# Patient Record
Sex: Female | Born: 1995
Health system: Southern US, Community
[De-identification: ages and names within clinical notes are randomized; demographics above are authoritative.]

## PROBLEM LIST (undated history)

## (undated) DIAGNOSIS — N739 Female pelvic inflammatory disease, unspecified: Secondary | ICD-10-CM

## (undated) DIAGNOSIS — R519 Headache, unspecified: Secondary | ICD-10-CM

## (undated) DIAGNOSIS — F329 Major depressive disorder, single episode, unspecified: Secondary | ICD-10-CM

## (undated) DIAGNOSIS — G43909 Migraine, unspecified, not intractable, without status migrainosus: Secondary | ICD-10-CM

## (undated) DIAGNOSIS — F32A Depression, unspecified: Secondary | ICD-10-CM

## (undated) DIAGNOSIS — F3281 Premenstrual dysphoric disorder: Secondary | ICD-10-CM

## (undated) DIAGNOSIS — R51 Headache: Secondary | ICD-10-CM

## (undated) DIAGNOSIS — F419 Anxiety disorder, unspecified: Secondary | ICD-10-CM

## (undated) HISTORY — DX: Female pelvic inflammatory disease, unspecified: N73.9

## (undated) HISTORY — DX: Premenstrual dysphoric disorder: F32.81

---
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
## 2016-01-07 ENCOUNTER — Encounter: Payer: Self-pay | Admitting: Emergency Medicine

## 2016-01-07 ENCOUNTER — Emergency Department
Admission: EM | Admit: 2016-01-07 | Discharge: 2016-01-08 | Disposition: A | Discharge status: Home / Self Care | Payer: Federal, State, Local not specified - PPO | Attending: Emergency Medicine | Admitting: Emergency Medicine

## 2016-01-07 DIAGNOSIS — Z792 Long term (current) use of antibiotics: Secondary | ICD-10-CM | POA: Insufficient documentation

## 2016-01-07 DIAGNOSIS — Z87891 Personal history of nicotine dependence: Secondary | ICD-10-CM | POA: Insufficient documentation

## 2016-01-07 DIAGNOSIS — R112 Nausea with vomiting, unspecified: Secondary | ICD-10-CM | POA: Diagnosis present

## 2016-01-07 DIAGNOSIS — Z79899 Other long term (current) drug therapy: Secondary | ICD-10-CM | POA: Diagnosis not present

## 2016-01-07 DIAGNOSIS — J020 Streptococcal pharyngitis: Secondary | ICD-10-CM | POA: Insufficient documentation

## 2016-01-07 DIAGNOSIS — R509 Fever, unspecified: Secondary | ICD-10-CM

## 2016-01-07 HISTORY — DX: Migraine, unspecified, not intractable, without status migrainosus: G43.909

## 2016-01-07 LAB — CBC
HCT: 40.1 % (ref 35.0–47.0)
Hemoglobin: 13.7 g/dL (ref 12.0–16.0)
MCH: 28.6 pg (ref 26.0–34.0)
MCHC: 34.1 g/dL (ref 32.0–36.0)
MCV: 83.9 fL (ref 80.0–100.0)
PLATELETS: 149 10*3/uL — AB (ref 150–440)
RBC: 4.77 MIL/uL (ref 3.80–5.20)
RDW: 13 % (ref 11.5–14.5)
WBC: 13 10*3/uL — ABNORMAL HIGH (ref 3.6–11.0)

## 2016-01-07 MED ORDER — ACETAMINOPHEN 650 MG RE SUPP
650.0000 mg | Freq: Once | RECTAL | Status: AC
  Administered 2016-01-07: 650 mg via RECTAL
  Filled 2016-01-07: qty 1

## 2016-01-07 MED ORDER — LIDOCAINE VISCOUS 2 % MT SOLN
15.0000 mL | Freq: Once | OROMUCOSAL | Status: AC
  Administered 2016-01-08: 15 mL via OROMUCOSAL
  Filled 2016-01-07: qty 15

## 2016-01-07 MED ORDER — DEXAMETHASONE SODIUM PHOSPHATE 10 MG/ML IJ SOLN
10.0000 mg | Freq: Once | INTRAMUSCULAR | Status: AC
  Administered 2016-01-07: 10 mg via INTRAVENOUS
  Filled 2016-01-07: qty 1

## 2016-01-07 MED ORDER — IBUPROFEN 100 MG/5ML PO SUSP
600.0000 mg | Freq: Once | ORAL | Status: AC
  Administered 2016-01-08: 600 mg via ORAL
  Filled 2016-01-07 (×2): qty 30

## 2016-01-07 MED ORDER — SODIUM CHLORIDE 0.9 % IV BOLUS (SEPSIS)
1000.0000 mL | Freq: Once | INTRAVENOUS | Status: AC
  Administered 2016-01-07: 1000 mL via INTRAVENOUS

## 2016-01-07 MED ORDER — ONDANSETRON HCL 4 MG/2ML IJ SOLN
4.0000 mg | Freq: Once | INTRAMUSCULAR | Status: AC
  Administered 2016-01-07: 4 mg via INTRAVENOUS
  Filled 2016-01-07: qty 2

## 2016-01-07 NOTE — ED Notes (Signed)
Pt was seen today at [facility] and diagnosed with strep throat; prescribed (and filled) PenVK and Zofran; has been running a fever all day of 103.0; now with abdominal pain and vomiting-has not started taking; pt says she was too weak to open the Zofran so she hasn't taken any; pt adds her mother is a physician and asked if she could get the PenVK shot while here instead of taking all the pills; pt awake and alert; talking in complete coherent sentences

## 2016-01-07 NOTE — ED Provider Notes (Signed)
[facility] Emergency Department Provider Note  ____________________________________________  Time seen: Approximately 11:35 PM  I have reviewed the triage vital signs and the nursing notes.   HISTORY  Chief Complaint Abdominal Pain and Emesis    HPI Lauren Braun is a 20 y.o. female who comes into the hospital today with a fever. The patient was diagnosed today with strep throat at [facility]. The patient's significant other reports thatshe has been unable to keep down any medicine, drooling and in and out of consciousness. They report that at 8 PM the patient vomited blood but they feel it was from her throat because it is so swollen. The patient has had a temperature up to 103 at home but is unable to keep anything down. She was also given Zofran for nausea but again hasn't been able to keep anything down. She has body aches and numbness and tingling in her fingers and toes. She reports that she can't even keep fluids down but has been trying to drink some water. The patient denies any sick contacts but reports that she induced vomiting and another person about a week ago. She reports that her mother is a physician and was wondering if she could receive a shot of antibiotic instead of oral antibiotics. Patient rates her throat pain as an 8 out of 10 in intensity. Patient reports that she has not eaten today and has some hunger pains but denies outright abdominal pain.   Past Medical History  Diagnosis Date  . Anxiety   . Depression   . Headache   . Migraines     Patient Active Problem List   Diagnosis Date Noted  . PTSD (post-traumatic stress disorder) 02/22/2015    History reviewed. No pertinent past surgical history.  Current Outpatient Rx  Name  Route  Sig  Dispense  Refill  . ondansetron (ZOFRAN-ODT) 4 MG disintegrating tablet   Oral   Take 4 mg by mouth 4 (four) times daily as needed for nausea or vomiting.         .  penicillin v potassium (VEETID) 500 MG tablet   Oral   Take 500 mg by mouth 2 (two) times daily.         . citalopram (CELEXA) 20 MG tablet   Oral   Take 1 tablet (20 mg total) by mouth daily.   30 tablet   1   . ibuprofen (ADVIL,MOTRIN) 200 MG tablet   Oral   Take 1 tablet (200 mg total) by mouth every 6 (six) hours as needed for headache (for migraine).   30 tablet   0   . sulfacetaminde-prednisoLONE (BLEPHAMIDE) ophthalmic ointment      Apply topically to right eyelid area of inflammation   3.5 g   0     Seborrheic blepharitis     Allergies Review of patient's allergies indicates no known allergies.  History reviewed. No pertinent family history.  Social History Social History  Substance Use Topics  . Smoking status: Former Games developer  . Smokeless tobacco: None  . Alcohol Use: Yes     Comment: occasional    Review of Systems Constitutional:  fever/chills Eyes: No visual changes. ENT:  sore throat. Cardiovascular: Denies chest pain. Respiratory: Denies shortness of breath. Gastrointestinal: Vomiting No abdominal pain. No diarrhea.  No constipation. Genitourinary: Negative for dysuria. Musculoskeletal: Negative for back pain. Skin: Negative for rash. Neurological: Negative for headaches, focal weakness or numbness.  10-point ROS otherwise negative.  ____________________________________________  PHYSICAL EXAM:  VITAL SIGNS: ED Triage Vitals  Enc Vitals Group     BP 01/07/16 2203 117/50 mmHg     Pulse Rate 01/07/16 2203 122     Resp 01/07/16 2203 20     Temp 01/07/16 2203 100.6 F (38.1 C)     Temp Source 01/07/16 2203 Oral     SpO2 01/07/16 2203 100 %     Weight 01/07/16 2203 114 lb (51.71 kg)     Height 01/07/16 2203  (1.676 m)     Head Cir --      Peak Flow --      Pain Score 01/07/16 2203 8     Pain Loc --      Pain Edu? --      Excl. in GC? --     Constitutional: Alert and oriented. Well appearing and in moderate distress. Eyes:  Conjunctivae are normal. PERRL. EOMI. Head: Atraumatic. Nose: No congestion/rhinnorhea. Mouth/Throat: Mucous membranes are moist.  Oropharynx erythematous with tonsillar exudate and cervical lymphadenopathy. Cardiovascular: Normal rate, regular rhythm. Grossly normal heart sounds.  Good peripheral circulation. Respiratory: Normal respiratory effort.  No retractions. Lungs CTAB. Gastrointestinal: Soft and nontender. No distention. Positive bowel sounds Musculoskeletal: No lower extremity tenderness nor edema.   Neurologic:  Normal speech and language.  Skin:  Skin is warm, dry and intact.  Psychiatric: Mood and affect are normal.   ____________________________________________   LABS (all labs ordered are listed, but only abnormal results are displayed)  Labs Reviewed  CBC - Abnormal; Notable for the following:    WBC 13.0 (*)    Platelets 149 (*)    All other components within normal limits  COMPREHENSIVE METABOLIC PANEL - Abnormal; Notable for the following:    CO2 21 (*)    Calcium 8.7 (*)    ALT 11 (*)    All other components within normal limits  MONONUCLEOSIS SCREEN  TROPONIN I  URINALYSIS COMPLETEWITH MICROSCOPIC ([facility] ONLY)   ____________________________________________  EKG  None ____________________________________________  RADIOLOGY  None ____________________________________________   PROCEDURES  Procedure(s) performed: None  Critical Care performed: No  ____________________________________________   INITIAL IMPRESSION / ASSESSMENT AND PLAN / ED COURSE  Pertinent labs & imaging results that were available during my care of the patient were reviewed by me and considered in my medical decision making (see chart for details).  This is an 20 year old female who was diagnosed with strep throat today that is also having some vomiting and unable to keep anything down due to pain and vomiting. The patient did receive a Tylenol suppository as well as Zofran  normal saline and Decadron prior to my evaluation. I will give the patient some oral ibuprofen as well as some viscous lidocaine and then attempt a by mouth trial. I will reassess the patient when she's received her medications.  The patient received 2 L of normal saline which helped with her symptoms. I did initially order the patient a dose of Bicillin but she was afraid that the medicine but hurt. After receiving the viscous lidocaine as well as the liquid ibuprofen the patient's temperature was improved and she felt that she would be able to keep down fluids. The patient ate a popsicle and then took her home dose of penicillin. The patient will be discharged to home to follow-up with student health. ____________________________________________   FINAL CLINICAL IMPRESSION(S) / ED DIAGNOSES  Final diagnoses:  Non-intractable vomiting with nausea, vomiting of unspecified type  Strep throat  Fever, unspecified  fever cause      Rebecka Apley, MD 01/08/16 [phone]

## 2016-01-08 LAB — COMPREHENSIVE METABOLIC PANEL
ALBUMIN: 4.1 g/dL (ref 3.5–5.0)
ALT: 11 U/L — ABNORMAL LOW (ref 14–54)
ANION GAP: 10 (ref 5–15)
AST: 17 U/L (ref 15–41)
Alkaline Phosphatase: 51 U/L (ref 38–126)
BUN: 11 mg/dL (ref 6–20)
CALCIUM: 8.7 mg/dL — AB (ref 8.9–10.3)
CO2: 21 mmol/L — AB (ref 22–32)
Chloride: 104 mmol/L (ref 101–111)
Creatinine, Ser: 0.82 mg/dL (ref 0.44–1.00)
GFR calc Af Amer: >60 mL/min (ref >60)
GFR calc non Af Amer: >60 mL/min (ref >60)
Glucose, Bld: 97 mg/dL (ref 65–99)
POTASSIUM: 3.5 mmol/L (ref 3.5–5.1)
SODIUM: 135 mmol/L (ref 135–145)
Total Bilirubin: 0.9 mg/dL (ref 0.3–1.2)
Total Protein: 7.8 g/dL (ref 6.5–8.1)

## 2016-01-08 LAB — MONONUCLEOSIS SCREEN: Mono Screen: NEGATIVE

## 2016-01-08 LAB — TROPONIN I

## 2016-01-08 MED ORDER — PENICILLIN G BENZATHINE 1200000 UNIT/2ML IM SUSP
1.2000 10*6.[IU] | Freq: Once | INTRAMUSCULAR | Status: DC
  Filled 2016-01-08 (×2): qty 2

## 2016-01-08 MED ORDER — SODIUM CHLORIDE 0.9 % IV BOLUS (SEPSIS)
1000.0000 mL | Freq: Once | INTRAVENOUS | Status: AC
  Administered 2016-01-08: 1000 mL via INTRAVENOUS

## 2016-01-08 NOTE — Discharge Instructions (Signed)
Nausea and Vomiting °Nausea is a sick feeling that often comes before throwing up (vomiting). Vomiting is a reflex where stomach contents come out of your mouth. Vomiting can cause severe loss of body fluids (dehydration). Children and elderly adults can become dehydrated quickly, especially if they also have diarrhea. Nausea and vomiting are symptoms of a condition or disease. It is important to find the cause of your symptoms. °CAUSES  °· Direct irritation of the stomach lining. This irritation can result from increased acid production (gastroesophageal reflux disease), infection, food poisoning, taking certain medicines (such as nonsteroidal anti-inflammatory drugs), alcohol use, or tobacco use. °· Signals from the brain. These signals could be caused by a headache, heat exposure, an inner ear disturbance, increased pressure in the brain from injury, infection, a tumor, or a concussion, pain, emotional stimulus, or metabolic problems. °· An obstruction in the gastrointestinal tract (bowel obstruction). °· Illnesses such as diabetes, hepatitis, gallbladder problems, appendicitis, kidney problems, cancer, sepsis, atypical symptoms of a heart attack, or eating disorders. °· Medical treatments such as chemotherapy and radiation. °· Receiving medicine that makes you sleep (general anesthetic) during surgery. °DIAGNOSIS °Your caregiver may ask for tests to be done if the problems do not improve after a few days. Tests may also be done if symptoms are severe or if the reason for the nausea and vomiting is not clear. Tests may include: °· Urine tests. °· Blood tests. °· Stool tests. °· Cultures (to look for evidence of infection). °· X-rays or other imaging studies. °Test results can help your caregiver make decisions about treatment or the need for additional tests. °TREATMENT °You need to stay well hydrated. Drink frequently but in small amounts. You may wish to drink water, sports drinks, clear broth, or eat frozen  ice pops or gelatin dessert to help stay hydrated. When you eat, eating slowly may help prevent nausea. There are also some antinausea medicines that may help prevent nausea. °HOME CARE INSTRUCTIONS  °· Take all medicine as directed by your caregiver. °· If you do not have an appetite, do not force yourself to eat. However, you must continue to drink fluids. °· If you have an appetite, eat a normal diet unless your caregiver tells you differently. °· Eat a variety of complex carbohydrates (rice, wheat, potatoes, bread), lean meats, yogurt, fruits, and vegetables. °· Avoid high-fat foods because they are more difficult to digest. °· Drink enough water and fluids to keep your urine clear or pale yellow. °· If you are dehydrated, ask your caregiver for specific rehydration instructions. Signs of dehydration may include: °· Severe thirst. °· Dry lips and mouth. °· Dizziness. °· Dark urine. °· Decreasing urine frequency and amount. °· Confusion. °· Rapid breathing or pulse. °SEEK IMMEDIATE MEDICAL CARE IF:  °· You have blood or brown flecks (like coffee grounds) in your vomit. °· You have black or bloody stools. °· You have a severe headache or stiff neck. °· You are confused. °· You have severe abdominal pain. °· You have chest pain or trouble breathing. °· You do not urinate at least once every 8 hours. °· You develop cold or clammy skin. °· You continue to vomit for longer than 24 to 48 hours. °· You have a fever. °MAKE SURE YOU:  °· Understand these instructions. °· Will watch your condition. °· Will get help right away if you are not doing well or get worse. °  °This information is not intended to replace advice given to you by your health care provider. Make sure   you discuss any questions you have with your health care provider. °  °Document Released: 12/02/2005 Document Revised: 02/24/2012 Document Reviewed: 05/01/2011 °Elsevier Interactive Patient Education ©2016 [employer] ° °Pharyngitis °Pharyngitis is  redness, pain, and swelling (inflammation) of your pharynx.  °CAUSES  °Pharyngitis is usually caused by infection. Most of the time, these infections are from viruses (viral) and are part of a cold. However, sometimes pharyngitis is caused by bacteria (bacterial). Pharyngitis can also be caused by allergies. Viral pharyngitis may be spread from person to person by coughing, sneezing, and personal items or utensils (cups, forks, spoons, toothbrushes). Bacterial pharyngitis may be spread from person to person by more intimate contact, such as kissing.  °SIGNS AND SYMPTOMS  °Symptoms of pharyngitis include:   °· Sore throat.   °· Tiredness (fatigue).   °· Low-grade fever.   °· Headache. °· Joint pain and muscle aches. °· Skin rashes. °· Swollen lymph nodes. °· Plaque-like film on throat or tonsils (often seen with bacterial pharyngitis). °DIAGNOSIS  °Your health care provider will ask you questions about your illness and your symptoms. Your medical history, along with a physical exam, is often all that is needed to diagnose pharyngitis. Sometimes, a rapid strep test is done. Other lab tests may also be done, depending on the suspected cause.  °TREATMENT  °Viral pharyngitis will usually get better in 3-4 days without the use of medicine. Bacterial pharyngitis is treated with medicines that kill germs (antibiotics).  °HOME CARE INSTRUCTIONS  °· Drink enough water and fluids to keep your urine clear or pale yellow.   °· Only take over-the-counter or prescription medicines as directed by your health care provider:   °¨ If you are prescribed antibiotics, make sure you finish them even if you start to feel better.   °¨ Do not take aspirin.   °· Get lots of rest.   °· Gargle with 8 oz of salt water (½ tsp of salt per 1 qt of water) as often as every 1-2 hours to soothe your throat.   °· Throat lozenges (if you are not at risk for choking) or sprays may be used to soothe your throat. °SEEK MEDICAL CARE IF:  °· You have large,  tender lumps in your neck. °· You have a rash. °· You cough up green, yellow-brown, or bloody spit. °SEEK IMMEDIATE MEDICAL CARE IF:  °· Your neck becomes stiff. °· You drool or are unable to swallow liquids. °· You vomit or are unable to keep medicines or liquids down. °· You have severe pain that does not go away with the use of recommended medicines. °· You have trouble breathing (not caused by a stuffy nose). °MAKE SURE YOU:  °· Understand these instructions. °· Will watch your condition. °· Will get help right away if you are not doing well or get worse. °  °This information is not intended to replace advice given to you by your health care provider. Make sure you discuss any questions you have with your health care provider. °  °Document Released: 12/02/2005 Document Revised: 09/22/2013 Document Reviewed: 08/09/2013 °Elsevier Interactive Patient Education ©2016 Elsevier Inc. ° °

## 2016-01-08 NOTE — ED Notes (Signed)
Pt declines pcn shot at this time.

## 2016-01-08 NOTE — ED Notes (Signed)
Pt states still feels like she has difficulty swallowing. Pt concerned regarding strep treatment. Order for pcn im ordered by md.

## 2016-01-08 NOTE — ED Notes (Signed)
Pt sleeping. 

## 2016-01-08 NOTE — ED Notes (Signed)
Pt awoken from sleep for repeat temperature.

## 2016-01-08 NOTE — ED Notes (Signed)
Pt has kept popsicle down.

## 2016-01-08 NOTE — ED Notes (Signed)
md notified of pt's declination of pcn shot. Pt states "i think i will try to keep the pill down." pt with very moist oral mucus membranes. Pt previously up to urinate, no sample collected. Pt with normal color warm and dry skin.

## 2016-03-21 ENCOUNTER — Ambulatory Visit
Admission: RE | Admit: 2016-03-21 | Discharge: 2016-03-21 | Disposition: A | Discharge status: Home / Self Care | Payer: Federal, State, Local not specified - PPO | Source: Ambulatory Visit | Attending: Family Medicine | Admitting: Family Medicine

## 2016-03-21 ENCOUNTER — Other Ambulatory Visit: Payer: Self-pay | Admitting: Family Medicine

## 2016-03-21 DIAGNOSIS — S0990XA Unspecified injury of head, initial encounter: Secondary | ICD-10-CM

## 2016-03-21 DIAGNOSIS — R079 Chest pain, unspecified: Secondary | ICD-10-CM | POA: Diagnosis not present

## 2016-03-21 DIAGNOSIS — M542 Cervicalgia: Secondary | ICD-10-CM | POA: Insufficient documentation

## 2016-03-21 DIAGNOSIS — M546 Pain in thoracic spine: Secondary | ICD-10-CM | POA: Insufficient documentation

## 2016-03-21 DIAGNOSIS — M545 Low back pain: Secondary | ICD-10-CM | POA: Diagnosis not present

## 2017-03-19 ENCOUNTER — Emergency Department
Admission: EM | Admit: 2017-03-19 | Discharge: 2017-03-19 | Disposition: A | Discharge status: Home / Self Care | Payer: Federal, State, Local not specified - PPO | Attending: Emergency Medicine | Admitting: Emergency Medicine

## 2017-03-19 ENCOUNTER — Encounter: Payer: Self-pay | Admitting: *Deleted

## 2017-03-19 DIAGNOSIS — Z79899 Other long term (current) drug therapy: Secondary | ICD-10-CM | POA: Diagnosis not present

## 2017-03-19 DIAGNOSIS — Z791 Long term (current) use of non-steroidal anti-inflammatories (NSAID): Secondary | ICD-10-CM | POA: Diagnosis not present

## 2017-03-19 DIAGNOSIS — R109 Unspecified abdominal pain: Secondary | ICD-10-CM

## 2017-03-19 DIAGNOSIS — Z87891 Personal history of nicotine dependence: Secondary | ICD-10-CM | POA: Insufficient documentation

## 2017-03-19 DIAGNOSIS — K625 Hemorrhage of anus and rectum: Secondary | ICD-10-CM

## 2017-03-19 DIAGNOSIS — R110 Nausea: Secondary | ICD-10-CM | POA: Diagnosis present

## 2017-03-19 LAB — COMPREHENSIVE METABOLIC PANEL
ALBUMIN: 4.5 g/dL (ref 3.5–5.0)
ALT: 13 U/L — ABNORMAL LOW (ref 14–54)
ANION GAP: 10 (ref 5–15)
AST: 22 U/L (ref 15–41)
Alkaline Phosphatase: 56 U/L (ref 38–126)
BUN: 12 mg/dL (ref 6–20)
CHLORIDE: 101 mmol/L (ref 101–111)
CO2: 27 mmol/L (ref 22–32)
Calcium: 9.4 mg/dL (ref 8.9–10.3)
Creatinine, Ser: 0.64 mg/dL (ref 0.44–1.00)
GFR calc Af Amer: >60 mL/min (ref >60)
Glucose, Bld: 94 mg/dL (ref 65–99)
POTASSIUM: 3.8 mmol/L (ref 3.5–5.1)
Sodium: 138 mmol/L (ref 135–145)
Total Bilirubin: 0.6 mg/dL (ref 0.3–1.2)
Total Protein: 9.1 g/dL — ABNORMAL HIGH (ref 6.5–8.1)

## 2017-03-19 LAB — URINALYSIS, COMPLETE (UACMP) WITH MICROSCOPIC
BACTERIA UA: NONE SEEN
BILIRUBIN URINE: NEGATIVE
Glucose, UA: NEGATIVE mg/dL
HGB URINE DIPSTICK: NEGATIVE
KETONES UR: 20 mg/dL — AB
LEUKOCYTES UA: NEGATIVE
Nitrite: NEGATIVE
PROTEIN: NEGATIVE mg/dL
SPECIFIC GRAVITY, URINE: 1.026 (ref 1.005–1.030)
pH: 5 (ref 5.0–8.0)

## 2017-03-19 LAB — CBC
HCT: 44.1 % (ref 35.0–47.0)
Hemoglobin: 15.1 g/dL (ref 12.0–16.0)
MCH: 29.5 pg (ref 26.0–34.0)
MCHC: 34.2 g/dL (ref 32.0–36.0)
MCV: 86.3 fL (ref 80.0–100.0)
Platelets: 233 10*3/uL (ref 150–440)
RBC: 5.1 MIL/uL (ref 3.80–5.20)
RDW: 14 % (ref 11.5–14.5)
WBC: 10.3 10*3/uL (ref 3.6–11.0)

## 2017-03-19 LAB — LIPASE, BLOOD: Lipase: 14 U/L (ref 11–51)

## 2017-03-19 LAB — POCT PREGNANCY, URINE: PREG TEST UR: NEGATIVE

## 2017-03-19 MED ORDER — ONDANSETRON HCL 4 MG PO TABS: 4.0000 mg | ORAL_TABLET | Freq: Three times a day (TID) | ORAL | 0 refills | Status: DC | PRN

## 2017-03-19 MED ORDER — DICYCLOMINE HCL 20 MG PO TABS: 20.0000 mg | ORAL_TABLET | Freq: Three times a day (TID) | ORAL | 0 refills | Status: DC | PRN

## 2017-03-19 MED ORDER — DICYCLOMINE HCL 10 MG PO CAPS
10.0000 mg | ORAL_CAPSULE | Freq: Once | ORAL | Status: AC
  Administered 2017-03-19: 10 mg via ORAL
  Filled 2017-03-19: qty 1

## 2017-03-19 MED ORDER — ONDANSETRON 4 MG PO TBDP
4.0000 mg | ORAL_TABLET | Freq: Once | ORAL | Status: AC
  Administered 2017-03-19: 4 mg via ORAL
  Filled 2017-03-19: qty 1

## 2017-03-19 NOTE — ED Notes (Signed)
States 3 episodes of blood in her stool today, states nausea and less then 400 calorie intake of 5 days, states fatigue, and lower back cramping, awake and alert in no acute distress, hx of IBS but states she has not had a flare up in years

## 2017-03-19 NOTE — ED Triage Notes (Signed)
Pt reports nausea for 4 days, generalized fatigue, decreased appetite with weight loss, pt denies fever

## 2017-03-19 NOTE — ED Provider Notes (Signed)
[facility] Emergency Department Provider Note   ____________________________________________   I have reviewed the triage vital signs and the nursing notes.   HISTORY  Chief Complaint Nausea   History limited by: Not Limited   HPI Lauren Braun is a 21 y.o. female who presents to the emergency department today because of concern for nausea, decreased appetite, bloody stool. The patient has had decreased oral intake for the past roughly 4 days.  She did start noticing some blood in her stool. This has been accompanied by some abdominal discomfort. She states she has a long history of issues with eating and nausea. She was diagnosed with IBS while in middle school but has not had any bad flare ups that resulted in rectal bleeding. She has not yet seen a GI doctor. She does state she did set up an appointment for Monday.    Past Medical History:  Diagnosis Date  . Anxiety   . Depression   . Headache   . Migraines     Patient Active Problem List   Diagnosis Date Noted  . PTSD (post-traumatic stress disorder) 02/22/2015    History reviewed. No pertinent surgical history.  Prior to Admission medications   Medication Sig Start Date End Date Taking? Authorizing Provider  citalopram (CELEXA) 20 MG tablet Take 1 tablet (20 mg total) by mouth daily. 02/24/15   Chauncey Mann, MD  ibuprofen (ADVIL,MOTRIN) 200 MG tablet Take 1 tablet (200 mg total) by mouth every 6 (six) hours as needed for headache (for migraine). 02/24/15   Chauncey Mann, MD  ondansetron (ZOFRAN-ODT) 4 MG disintegrating tablet Take 4 mg by mouth 4 (four) times daily as needed for nausea or vomiting.    Historical Provider, MD  penicillin v potassium (VEETID) 500 MG tablet Take 500 mg by mouth 2 (two) times daily.    Historical Provider, MD  sulfacetaminde-prednisoLONE [facility]) ophthalmic ointment Apply topically to right eyelid area of inflammation 02/24/15   Chauncey Mann, MD     Allergies Patient has no known allergies.  No family history on file.  Social History Social History  Substance Use Topics  . Smoking status: Former Smoker    Types: E-cigarettes  . Smokeless tobacco: Not on file  . Alcohol use Yes     Comment: occasional    Review of Systems  Constitutional: Negative for fever. Cardiovascular: Negative for chest pain. Respiratory: Negative for shortness of breath. Gastrointestinal: Positive for abdominal pain. Genitourinary: Negative for dysuria. Musculoskeletal: Negative for back pain. Skin: Negative for rash. Neurological: Negative for headaches, focal weakness or numbness.  10-point ROS otherwise negative.  ____________________________________________   PHYSICAL EXAM:  VITAL SIGNS: ED Triage Vitals [03/19/17 1651]  Enc Vitals Group     BP 124/75     Pulse Rate 90     Resp 20     Temp 98.4 F (36.9 C)     Temp Source Oral     SpO2 100 %     Weight 116 lb (52.6 kg)     Height  (1.702 m)    Constitutional: Alert and oriented. Well appearing and in no distress. Eyes: Conjunctivae are normal. Normal extraocular movements. ENT   Head: Normocephalic and atraumatic.   Nose: No congestion/rhinnorhea.   Mouth/Throat: Mucous membranes are moist.   Neck: No stridor. Hematological/Lymphatic/Immunilogical: No cervical lymphadenopathy. Cardiovascular: Normal rate, regular rhythm.  No murmurs, rubs, or gallops. Respiratory: Normal respiratory effort without tachypnea nor retractions. Breath sounds are clear and equal  bilaterally. No wheezes/rales/rhonchi. Gastrointestinal: Soft and tender to palpation of the abdomen, primarily in the upper abdomen.  Genitourinary: Deferred Musculoskeletal: Normal range of motion in all extremities. No lower extremity edema. Neurologic:  Normal speech and language. No gross focal neurologic deficits are appreciated.  Skin:  Skin is warm, dry and intact. No rash  noted. Psychiatric: Mood and affect are normal. Speech and behavior are normal. Patient exhibits appropriate insight and judgment.  ____________________________________________    LABS (pertinent positives/negatives)  Labs Reviewed  COMPREHENSIVE METABOLIC PANEL - Abnormal; Notable for the following:       Result Value   Total Protein 9.1 (*)    ALT 13 (*)    All other components within normal limits  URINALYSIS, COMPLETE (UACMP) WITH MICROSCOPIC - Abnormal; Notable for the following:    Color, Urine YELLOW (*)    APPearance HAZY (*)    Ketones, ur 20 (*)    Squamous Epithelial / LPF 0-5 (*)    All other components within normal limits  LIPASE, BLOOD  CBC  POCT PREGNANCY, URINE  POC URINE PREG, ED     ____________________________________________   EKG  None  ____________________________________________    RADIOLOGY  None  ____________________________________________   PROCEDURES  Procedures  ____________________________________________   INITIAL IMPRESSION / ASSESSMENT AND PLAN / ED COURSE  Pertinent labs & imaging results that were available during my care of the patient were reviewed by me and considered in my medical decision making (see chart for details).  Patient presented because of concern for rectal bleeding abdominal pain. Blood work and urine without concerning findings. Patient's nausea did improve with zofran and she was able to eat here in the emergency department. At this time given lack of leukocytosis or focal abdominal pain do not think emergent abdominal imaging necessary. Patient has follow up with GI scheduled for next week. Will discharge with prescription for zofran and bentyl. Discussed return precautions.  ____________________________________________   FINAL CLINICAL IMPRESSION(S) / ED DIAGNOSES  Final diagnoses:  Abdominal pain, unspecified abdominal location  Rectal bleeding     Note: This dictation was prepared with  Dragon dictation. Any transcriptional errors that result from this process are unintentional     Phineas Semen, MD 03/19/17 [phone]

## 2017-03-19 NOTE — Discharge Instructions (Signed)
Please seek medical attention for any high fevers, chest pain, shortness of breath, change in behavior, persistent vomiting, bloody stool or any other new or concerning symptoms.  

## 2017-06-30 ENCOUNTER — Encounter: Payer: Self-pay | Admitting: Obstetrics and Gynecology

## 2017-06-30 ENCOUNTER — Ambulatory Visit (INDEPENDENT_AMBULATORY_CARE_PROVIDER_SITE_OTHER): Payer: Federal, State, Local not specified - PPO | Admitting: Obstetrics and Gynecology

## 2017-06-30 VITALS — BP 106/68 | HR 92 | Ht 67.0 in | Wt 119.6 lb

## 2017-06-30 DIAGNOSIS — Z30432 Encounter for removal of intrauterine contraceptive device: Secondary | ICD-10-CM | POA: Diagnosis not present

## 2017-06-30 DIAGNOSIS — Z3009 Encounter for other general counseling and advice on contraception: Secondary | ICD-10-CM | POA: Diagnosis not present

## 2017-06-30 DIAGNOSIS — Z8639 Personal history of other endocrine, nutritional and metabolic disease: Secondary | ICD-10-CM

## 2017-06-30 DIAGNOSIS — Z8619 Personal history of other infectious and parasitic diseases: Secondary | ICD-10-CM

## 2017-06-30 NOTE — Progress Notes (Signed)
HPI:      [name] is a 21 y.o. G0P0000 who LMP was No LMP recorded. Patient is not currently having periods (Reason: IUD).  Subjective:   She presents today Requesting IUD removal. She had a skyla followed by a kylena.  She was very happy with the Schuyler but does not like the kylena.  She complains of pelvic pain, pain with intercourse. She states that she has been dealing with this ever since the IUD was placed. She has not decided upon future birth control methods. She is currently not sexually active. Her history includes a history of delayed puberty (age 68) likely secondary to low weight from active bulimia. She is doing better with her bulimia but still struggles with this at times. She is receiving counseling. Her history also includes Chlamydia diagnosed in January and treated. Her test of cure in May was negative and she has not been sexually active since.    Hx: The following portions of the patient's history were reviewed and updated as appropriate:             She  has a past medical history of Anxiety; Depression; Headache; Migraines; PID (pelvic inflammatory disease); and PMDD (premenstrual dysphoric disorder). She  does not have any pertinent problems on file. She  has no past surgical history on file. Her family history includes Cancer in her paternal grandmother; Diabetes in her father. She  reports that she has quit smoking. Her smoking use included E-cigarettes. She has never used smokeless tobacco. She reports that she drinks alcohol. She reports that she does not use drugs. She has No Known Allergies.       Review of Systems:  Review of Systems  Constitutional: Denied constitutional symptoms, night sweats, recent illness, fatigue, fever, insomnia and weight loss.  Eyes: Denied eye symptoms, eye pain, photophobia, vision change and visual disturbance.  Ears/Nose/Throat/Neck: Denied ear, nose, throat or neck symptoms, hearing loss, nasal discharge, sinus  congestion and sore throat.  Cardiovascular: Denied cardiovascular symptoms, arrhythmia, chest pain/pressure, edema, exercise intolerance, orthopnea and palpitations.  Respiratory: Denied pulmonary symptoms, asthma, pleuritic pain, productive sputum, cough, dyspnea and wheezing.  Gastrointestinal: Denied, gastro-esophageal reflux, melena, nausea and vomiting.  Genitourinary: See HPI for additional information.  Musculoskeletal: Denied musculoskeletal symptoms, stiffness, swelling, muscle weakness and myalgia.  Dermatologic: Denied dermatology symptoms, rash and scar.  Neurologic: Denied neurology symptoms, dizziness, headache, neck pain and syncope.  Psychiatric: Denied psychiatric symptoms, anxiety and depression.  Endocrine: Denied endocrine symptoms including hot flashes and night sweats.   Meds:   Current Outpatient Prescriptions on File Prior to Visit  Medication Sig Dispense Refill  . ibuprofen (ADVIL,MOTRIN) 200 MG tablet Take 1 tablet (200 mg total) by mouth every 6 (six) hours as needed for headache (for migraine). 30 tablet 0  . ondansetron (ZOFRAN-ODT) 4 MG disintegrating tablet Take 4 mg by mouth 4 (four) times daily as needed for nausea or vomiting.    . citalopram (CELEXA) 20 MG tablet Take 1 tablet (20 mg total) by mouth daily. (Patient not taking: Reported on 06/30/2017) 30 tablet 1  . dicyclomine (BENTYL) 20 MG tablet Take 1 tablet (20 mg total) by mouth 3 (three) times daily as needed (abdominal pain). (Patient not taking: Reported on 06/30/2017) 30 tablet 0  . ondansetron (ZOFRAN) 4 MG tablet Take 1 tablet (4 mg total) by mouth every 8 (eight) hours as needed for nausea or vomiting. (Patient not taking: Reported on 06/30/2017) 20 tablet 0  . penicillin v  potassium (VEETID) 500 MG tablet Take 500 mg by mouth 2 (two) times daily.    Marland Kitchen. sulfacetaminde-prednisoLONE (BLEPHAMIDE) ophthalmic ointment Apply topically to right eyelid area of inflammation 3.5 g 0   No current  facility-administered medications on file prior to visit.     Objective:     Vitals:   06/30/17 1100  BP: 106/68  Pulse: 92              Physical examination   Pelvic:   Vulva: Normal appearance.  No lesions.  Vagina: No lesions or abnormalities noted.  Support: Normal pelvic support.  Urethra No masses tenderness or scarring.  Meatus Normal size without lesions or prolapse.  Cervix: Normal appearance.  No lesions. IUD strings noted at cervical os.  Anus: Normal exam.  No lesions.  Perineum: Normal exam.  No lesions.        Bimanual   Uterus: Normal size.  Non-tender.  Mobile.  ?retroverted  Adnexae: No masses.  Non-tender to palpation.  Cul-de-sac: Negative for abnormality.   IUD Removal Strings of IUD identified and grasped.  IUD removed without problem.  Pt tolerated this well.  IUD noted to be intact.    Assessment:    G0P0000 Patient Active Problem List   Diagnosis Date Noted  . PTSD (post-traumatic stress disorder) 02/22/2015     1. Birth control counseling   2. Encounter for IUD removal   3. History of delayed puberty   4. History of chlamydia infection        Plan:            1.  When patient has decided upon birth control method she will contact [facility]..  2.  Patient requested STD testing for GC and chlamydia (done)     Meds ordered this encounter  Medications  . omeprazole (PRILOSEC) 20 MG capsule    Sig: Take 20 mg by mouth daily.  . Levonorgestrel 19.5 MG IUD    Sig: by Intrauterine route.        F/U  No Follow-up on file.  Elonda Huskyavid J. Emmalia Heyboer, M.D. 06/30/2017 12:26 PM

## 2017-07-04 LAB — GC/CHLAMYDIA PROBE AMP
CHLAMYDIA, DNA PROBE: NEGATIVE
Neisseria gonorrhoeae by PCR: NEGATIVE

## 2017-07-07 ENCOUNTER — Other Ambulatory Visit: Payer: Federal, State, Local not specified - PPO

## 2017-07-07 ENCOUNTER — Ambulatory Visit (INDEPENDENT_AMBULATORY_CARE_PROVIDER_SITE_OTHER): Payer: Federal, State, Local not specified - PPO | Admitting: Obstetrics and Gynecology

## 2017-07-07 ENCOUNTER — Encounter: Payer: Self-pay | Admitting: Obstetrics and Gynecology

## 2017-07-07 VITALS — BP 112/71 | HR 87 | Ht 67.0 in | Wt 120.1 lb

## 2017-07-07 DIAGNOSIS — Z8639 Personal history of other endocrine, nutritional and metabolic disease: Secondary | ICD-10-CM | POA: Diagnosis not present

## 2017-07-07 DIAGNOSIS — Z30011 Encounter for initial prescription of contraceptive pills: Secondary | ICD-10-CM

## 2017-07-07 LAB — POCT URINE PREGNANCY: Preg Test, Ur: NEGATIVE

## 2017-07-07 MED ORDER — DESOGESTREL-ETHINYL ESTRADIOL 0.15-0.02/0.01 MG (21/5) PO TABS: 1.0000 | ORAL_TABLET | Freq: Every day | ORAL | 2 refills | Status: DC

## 2017-07-07 NOTE — Progress Notes (Signed)
HPI:      [name] is a 20 y.o. G0P0000 who LMP was No LMP recorded (lmp unknown).  Subjective:   She presents today She has not decided upon method of birth control. She would like to start OCPs. She is not been sexually active since her IUD removal. Urinary pregnancy test today is negative. We have also further discussed her delayed onset of puberty. Although she has had several episodes of very light vaginal bleeding she has never had a true menstrual period. She has never required tampons or pads. She also states that she has some dark hair growth under her lip that her mother and her sister do not have.    Hx: The following portions of the patient's history were reviewed and updated as appropriate:             She  has a past medical history of Anxiety; Depression; Headache; Migraines; PID (pelvic inflammatory disease); and PMDD (premenstrual dysphoric disorder). She  does not have any pertinent problems on file. She  has no past surgical history on file. Her family history includes Cancer in her paternal grandmother; Diabetes in her father. She  reports that she has quit smoking. Her smoking use included E-cigarettes. She has never used smokeless tobacco. She reports that she drinks alcohol. She reports that she does not use drugs. She has No Known Allergies.       Review of Systems:  Review of Systems  Constitutional: Denied constitutional symptoms, night sweats, recent illness, fatigue, fever, insomnia and weight loss.  Eyes: Denied eye symptoms, eye pain, photophobia, vision change and visual disturbance.  Ears/Nose/Throat/Neck: Denied ear, nose, throat or neck symptoms, hearing loss, nasal discharge, sinus congestion and sore throat.  Cardiovascular: Denied cardiovascular symptoms, arrhythmia, chest pain/pressure, edema, exercise intolerance, orthopnea and palpitations.  Respiratory: Denied pulmonary symptoms, asthma, pleuritic pain, productive sputum, cough, dyspnea  and wheezing.  Gastrointestinal: Denied, gastro-esophageal reflux, melena, nausea and vomiting.  Genitourinary: Denied genitourinary symptoms including symptomatic vaginal discharge, pelvic relaxation issues, and urinary complaints.  Musculoskeletal: Denied musculoskeletal symptoms, stiffness, swelling, muscle weakness and myalgia.  Dermatologic: Denied dermatology symptoms, rash and scar.  Neurologic: Denied neurology symptoms, dizziness, headache, neck pain and syncope.  Psychiatric: Denied psychiatric symptoms, anxiety and depression.  Endocrine: See HPI for additional information.   Meds:   Current Outpatient Prescriptions on File Prior to Visit  Medication Sig Dispense Refill  . ibuprofen (ADVIL,MOTRIN) 200 MG tablet Take 1 tablet (200 mg total) by mouth every 6 (six) hours as needed for headache (for migraine). 30 tablet 0   No current facility-administered medications on file prior to visit.     Objective:     Vitals:   07/07/17 1319  BP: 112/71  Pulse: 87                Assessment:    G0P0000 Patient Active Problem List   Diagnosis Date Noted  . PTSD (post-traumatic stress disorder) 02/22/2015     1. Encounter for initial prescription of contraceptive pills   2. H/O delayed puberty     Her likely issue with the flu puberty is her history of bulimia. Because of some of her other symptoms I will order some blood work to further rule out other hypogonadic conditions.   Plan:            1.  OCPs The risks /benefits of OCPs have been explained to the patient in detail.  Product literature has been given to her.  I have instructed her in the use of OCPs and have given her literature reinforcing this information.  I have explained to the patient that OCPs are not as effective for birth control during the first month of use, and that another form of contraception should be used during this time.  Both first-day start and Sunday start have been explained.  The risks and  benefits of each was discussed.  She has been made aware of  the fact that other medications may affect the efficacy of OCPs.  I have answered all of her questions, and I believe that she has an understanding of the effectiveness and use of OCPs. 2.  FSH, LH, prolactin, free and total testosterone, DHEA sulfate, TSH Orders Orders Placed This Encounter  Procedures  . TSH  . FSH/LH  . Testosterone, Free, Total, SHBG  . Prolactin  . DHEA-sulfate  . POCT urine pregnancy     Meds ordered this encounter  Medications  . desogestrel-ethinyl estradiol (MIRCETTE) 0.15-0.02/0.01 MG (21/5) tablet    Sig: Take 1 tablet by mouth at bedtime.    Dispense:  1 Package    Refill:  2        F/U  Return in about 3 months (around 10/07/2017).  Elonda Huskyavid J. Aarika Moon, M.D. 07/07/2017 2:23 PM

## 2017-07-08 LAB — FSH/LH
FSH: 6.7 m[IU]/mL
LH: 8.7 m[IU]/mL

## 2017-07-08 LAB — DHEA-SULFATE: DHEA SO4: 425.5 ug/dL (ref 110.0–431.7)

## 2017-07-08 LAB — TESTOSTERONE, FREE, TOTAL, SHBG
Sex Hormone Binding: 44.3 nmol/L (ref 24.6–122.0)
Testosterone, Free: 0.9 pg/mL (ref 0.0–4.2)
Testosterone: 24 ng/dL (ref 8–48)

## 2017-07-08 LAB — PROLACTIN: Prolactin: 10 ng/mL (ref 4.8–23.3)

## 2017-07-08 LAB — TSH: TSH: 1.71 u[IU]/mL (ref 0.450–4.500)

## 2017-07-09 ENCOUNTER — Telehealth: Payer: Self-pay

## 2017-07-09 NOTE — Telephone Encounter (Signed)
Message left on pts voicemail of neg results per provider.

## 2017-07-09 NOTE — Telephone Encounter (Signed)
-----   Message from Linzie Collinavid James Evans, MD sent at 07/08/2017  3:02 PM EDT ----- All results within normal range.

## 2017-07-10 ENCOUNTER — Telehealth: Payer: Self-pay

## 2017-07-10 NOTE — Telephone Encounter (Signed)
Left message on pts voicemail re: neg test results. Also asked pt to let us know she got this message.

## 2017-07-11 ENCOUNTER — Telehealth: Payer: Self-pay

## 2017-07-11 NOTE — Telephone Encounter (Signed)
Pt confirmed she received my message.
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
## 2017-08-26 ENCOUNTER — Telehealth: Payer: Self-pay | Admitting: Obstetrics and Gynecology

## 2017-08-26 DIAGNOSIS — Z30011 Encounter for initial prescription of contraceptive pills: Secondary | ICD-10-CM

## 2017-08-26 MED ORDER — DESOGESTREL-ETHINYL ESTRADIOL 0.15-0.02/0.01 MG (21/5) PO TABS: 1.0000 | ORAL_TABLET | Freq: Every day | ORAL | 2 refills | Status: DC

## 2017-08-26 NOTE — Telephone Encounter (Signed)
Patient called and stated that she would like to get her B.C. Refilled earlier due to the bad weather that is expected at the end of the week. The patient would like to get the refill 9/11 or 9/12. No other information was disclosed. Please advise.

## 2017-09-09 ENCOUNTER — Telehealth: Payer: Self-pay | Admitting: Obstetrics and Gynecology

## 2017-09-09 NOTE — Telephone Encounter (Signed)
Please call patient - she missed taking her BC pill by 12 hours or a little less and had unprotected sex 2 days ago and she wants to know if she should get the plan B pill  Please call ASAP to let her know

## 2017-09-09 NOTE — Telephone Encounter (Signed)
Spoke with provider

## 2017-09-23 ENCOUNTER — Telehealth: Payer: Self-pay | Admitting: Obstetrics and Gynecology

## 2017-09-23 NOTE — Telephone Encounter (Signed)
Spoke with pt- she states she transferred her scripts from [facility] in [place] to [facility] [place] were [facility]. [facility] and she stated no she had nothing on file. She stated she would call [facility] and have the script transferred. I called pt back and she states she picked up a script( OCPs) and didn't realize there were refills on it. She will take it to the pharmacy to get refilled.

## 2017-09-23 NOTE — Telephone Encounter (Signed)
Patient is leaving to go out of town tomorrow and needs refill on her [facility] pills asap   [facility] [place]   Pharmacy was supposed to have sent request with no response   Please call

## 2017-11-13 ENCOUNTER — Ambulatory Visit (INDEPENDENT_AMBULATORY_CARE_PROVIDER_SITE_OTHER): Payer: Federal, State, Local not specified - PPO | Admitting: Obstetrics and Gynecology

## 2017-11-13 ENCOUNTER — Encounter: Payer: Self-pay | Admitting: Obstetrics and Gynecology

## 2017-11-13 VITALS — BP 115/74 | HR 74 | Ht 67.0 in | Wt 126.0 lb

## 2017-11-13 DIAGNOSIS — Z30011 Encounter for initial prescription of contraceptive pills: Secondary | ICD-10-CM | POA: Diagnosis not present

## 2017-11-13 DIAGNOSIS — N760 Acute vaginitis: Secondary | ICD-10-CM

## 2017-11-13 DIAGNOSIS — B9689 Other specified bacterial agents as the cause of diseases classified elsewhere: Secondary | ICD-10-CM | POA: Diagnosis not present

## 2017-11-13 DIAGNOSIS — Z3041 Encounter for surveillance of contraceptive pills: Secondary | ICD-10-CM | POA: Diagnosis not present

## 2017-11-13 MED ORDER — METRONIDAZOLE 500 MG PO TABS: 500.0000 mg | ORAL_TABLET | Freq: Two times a day (BID) | ORAL | 0 refills | Status: AC

## 2017-11-13 MED ORDER — DESOGESTREL-ETHINYL ESTRADIOL 0.15-0.02/0.01 MG (21/5) PO TABS: 1.0000 | ORAL_TABLET | Freq: Every day | ORAL | 2 refills | Status: DC

## 2017-11-13 NOTE — Progress Notes (Signed)
HPI:      Lauren Braun is a 21 y.o. G0P0000 who LMP was Patient's last menstrual period was 10/21/2017 (exact date).  Subjective:   She presents today with complaint of 1 week of vaginal discharge with odor.  She also states that her boyfriend has testicular pain with orgasm.  She is concerned that she may have trichomonas.  She states that prior to their relationship she required him to have STD testing and she herself was tested.  As far as she knows there has not been any infidelity since that time. She reports that OCPs are going well for her and she would like to continue taking them.    Hx: The following portions of the patient's history were reviewed and updated as appropriate:             She  has a past medical history of Anxiety, Depression, Headache, Migraines, PID (pelvic inflammatory disease), and PMDD (premenstrual dysphoric disorder). She does not have any pertinent problems on file. She  has no past surgical history on file. Her family history includes Cancer in her paternal grandmother; Diabetes in her father. She  reports that she has quit smoking. Her smoking use included e-cigarettes. she has never used smokeless tobacco. She reports that she drinks alcohol. She reports that she does not use drugs. She has No Known Allergies.       Review of Systems:  Review of Systems  Constitutional: Denied constitutional symptoms, night sweats, recent illness, fatigue, fever, insomnia and weight loss.  Eyes: Denied eye symptoms, eye pain, photophobia, vision change and visual disturbance.  Ears/Nose/Throat/Neck: Denied ear, nose, throat or neck symptoms, hearing loss, nasal discharge, sinus congestion and sore throat.  Cardiovascular: Denied cardiovascular symptoms, arrhythmia, chest pain/pressure, edema, exercise intolerance, orthopnea and palpitations.  Respiratory: Denied pulmonary symptoms, asthma, pleuritic pain, productive sputum, cough, dyspnea and wheezing.   Gastrointestinal: Denied, gastro-esophageal reflux, melena, nausea and vomiting.  Genitourinary: See HPI for additional information.  Musculoskeletal: Denied musculoskeletal symptoms, stiffness, swelling, muscle weakness and myalgia.  Dermatologic: Denied dermatology symptoms, rash and scar.  Neurologic: Denied neurology symptoms, dizziness, headache, neck pain and syncope.  Psychiatric: Denied psychiatric symptoms, anxiety and depression.  Endocrine: Denied endocrine symptoms including hot flashes and night sweats.   Meds:   Current Outpatient Medications on File Prior to Visit  Medication Sig Dispense Refill  . ibuprofen (ADVIL,MOTRIN) 200 MG tablet Take 1 tablet (200 mg total) by mouth every 6 (six) hours as needed for headache (for migraine). 30 tablet 0   No current facility-administered medications on file prior to visit.     Objective:     Vitals:   11/13/17 0859  BP: 115/74  Pulse: 74              Physical examination   Pelvic:   Vulva: Normal appearance.  No lesions.  Vagina: No lesions or abnormalities noted.  Support: Normal pelvic support.  Urethra No masses tenderness or scarring.  Meatus Normal size without lesions or prolapse.  Cervix: Normal appearance.  No lesions.  Anus: Normal exam.  No lesions.  Perineum: Normal exam.  No lesions.        Bimanual   Uterus: Normal size.  Non-tender.  Mobile.  AV.  Adnexae: No masses.  Non-tender to palpation.  Cul-de-sac: Negative for abnormality.   WET PREP: clue cells: present, KOH (yeast): negative, odor: present and trichomoniasis: negative Ph:  > 4.5   Assessment:    G0P0000 Patient Active Problem List  Diagnosis Date Noted  . PTSD (post-traumatic stress disorder) 02/22/2015     1. Bacterial vulvovaginitis   2. Encounter for surveillance of contraceptive pills   3. Encounter for initial prescription of contraceptive pills     Patient now has concerns regarding her boyfriend and is interested in  gonorrhea and Chlamydia testing.  She previously had chlamydia and was treated with a negative test of cure.   Plan:            1.  Flagyl for BV  2.  GC/CT performed.  Will inform patient when results return.  Orders Orders Placed This Encounter  Procedures  . GC/Chlamydia Probe Amp     Meds ordered this encounter  Medications  . metroNIDAZOLE (FLAGYL) 500 MG tablet    Sig: Take 1 tablet (500 mg total) by mouth 2 (two) times daily for 7 days.    Dispense:  14 tablet    Refill:  0  . desogestrel-ethinyl estradiol (MIRCETTE) 0.15-0.02/0.01 MG (21/5) tablet    Sig: Take 1 tablet by mouth at bedtime.    Dispense:  1 Package    Refill:  2      F/U  Return for We will contact her with any abnormal test results.  Elonda Huskyavid J. Shaelyn Decarli, M.D. 11/13/2017 9:49 AM

## 2017-11-17 LAB — GC/CHLAMYDIA PROBE AMP
Chlamydia trachomatis, NAA: NEGATIVE
NEISSERIA GONORRHOEAE BY PCR: NEGATIVE

## 2018-02-03 ENCOUNTER — Other Ambulatory Visit: Payer: Self-pay

## 2018-02-03 DIAGNOSIS — Z30011 Encounter for initial prescription of contraceptive pills: Secondary | ICD-10-CM

## 2018-02-03 MED ORDER — DESOGESTREL-ETHINYL ESTRADIOL 0.15-0.02/0.01 MG (21/5) PO TABS: 1.0000 | ORAL_TABLET | Freq: Every day | ORAL | 2 refills | Status: DC

## 2018-04-22 ENCOUNTER — Other Ambulatory Visit: Payer: Self-pay

## 2018-04-22 ENCOUNTER — Other Ambulatory Visit: Payer: Self-pay | Admitting: Obstetrics and Gynecology

## 2018-04-22 ENCOUNTER — Ambulatory Visit: Payer: Federal, State, Local not specified - PPO | Attending: Nurse Practitioner

## 2018-04-22 DIAGNOSIS — Z30011 Encounter for initial prescription of contraceptive pills: Secondary | ICD-10-CM

## 2018-04-22 DIAGNOSIS — M25562 Pain in left knee: Secondary | ICD-10-CM

## 2018-04-22 DIAGNOSIS — M6281 Muscle weakness (generalized): Secondary | ICD-10-CM | POA: Diagnosis present

## 2018-04-22 DIAGNOSIS — R262 Difficulty in walking, not elsewhere classified: Secondary | ICD-10-CM | POA: Diagnosis present

## 2018-04-22 NOTE — Addendum Note (Signed)
Addended by: Charlene Brooke on: 04/22/2018 07:31 PM   Modules accepted: Orders

## 2018-04-22 NOTE — Therapy (Signed)
[facility] [address], [place], [address] Phone: [phone]   Fax:  [phone]  Physical Therapy Evaluation  Patient Details  Name: Lauren Braun MRN: [id number] Date of Birth: 1996/11/06 Referring Provider: Viviano Simas, FNP   Encounter Date: 04/22/2018  PT End of Session - 04/22/18 1736    Visit Number  1    Number of Visits  7    Date for PT Re-Evaluation  05/14/18    PT Start Time  1736    PT Stop Time  1843    PT Time Calculation (min)  67 min    Activity Tolerance  Patient tolerated treatment well    Behavior During Therapy  [facility] for tasks assessed/performed       Past Medical History:  Diagnosis Date  . Anxiety   . Depression   . Headache   . Migraines   . PID (pelvic inflammatory disease)   . PMDD (premenstrual dysphoric disorder)     No past surgical history on file.  There were no vitals filed for this visit.   Subjective Assessment - 04/22/18 1742    Subjective  L knee pain: 4/10 L knee pain currently and at best (pt sitting with L knee in extension); 9/10 at worst (at the time of injury)    Pertinent History  L knee sprain. Has had problems with her L knee since 22 years old. Has been to PT before. L knee cap keeps dislocating.  Saturday May 4, pt fell over and had a sharp pain in her knee. Pt tried to push her knee cap medially which made it feel better. The next day, pt woke up and her L toes would not move from stiffness. L leg was "purplish" from the knee down. Went to health services who determined that her knee cap is where it needs to be. When the swelling subsided, pt was able to move her toes. The pain now is a constant dull ache. If pt feels a sharp feeling, its inferior to her patella.  Prior PT involved strengthening her medial and lateral thigh as well as straight leg raises.  Pt states that for as long as she can remember, her L knee has never been fully straight.  Has  not had recent imaging for her L knee.  Pt states that her biggest concern is her L knee collapsing when she walks.  Had a fall this afternoon when her dog jumped onto her knee.  No other fall besides that.   The previous PT helped for a little bit which helped decrease the frequency of her injuries.  Pt was using bilateral crutches during long distance walks, but uses one crutch during short distances.  no hx of seizures    Patient Stated Goals  Be able to straighten her L knee and strengthen her L LE to decrease chances of recurrence.     Currently in Pain?  Yes    Pain Score  4     Pain Location  Knee    Pain Orientation  Left    Pain Descriptors / Indicators  Aching;Dull;Sharp    Pain Type  Acute pain;Chronic pain acute on chronic    Pain Onset  In the past 7 days    Pain Frequency  Constant    Aggravating Factors   walking, weight bearing     Pain Relieving Factors  rest, ice, wrapping her knee, no weight bearing  [facility] PT Assessment - 04/22/18 1737      Assessment   Medical Diagnosis  L knee sprain    Referring Provider  Viviano Simas, FNP    Onset Date/Surgical Date  04/18/18    Prior Therapy  Pt had PT for L knee when she was around 22 years old with good results.       Precautions   Precaution Comments  no known precautions      Restrictions   Other Position/Activity Restrictions  no known weight bearing restrictions      Balance Screen   Has the patient fallen in the past 6 months  Yes    How many times?  1 Pt dog jupmed onto her L LE      Prior Function   Vocation  Student    Vocation Requirements  PLOF: no difficulty walking, performing standing tasks.       Observation/Other Assessments   Observations  (-) lachman's test. Medial patellar pain with valgus stress test, (-) varus stress test. (-) anterior and posterior drawer test.       Posture/Postural Control   Posture Comments  protracted neck, decreased L LE weight bearing due to pain, bilateral genu  valgus      AROM   Right Knee Extension  -- full     Right Knee Flexion  -- full    Left Knee Extension  -40 with patellar tendon pressure    Left Knee Flexion  104      Strength   Right Hip Extension  4/5    Right Hip External Rotation   4/5    Right Hip ABduction  4+/5    Left Hip Extension  3+/5    Left Hip External Rotation  4-/5 with medial > lateral patellar pain    Left Hip ABduction  4-/5 with lateral thigh discomfort    Right Knee Flexion  4+/5    Right Knee Extension  5/5    Left Knee Flexion  4-/5 with inferior lateral patellar pain    Left Knee Extension  4-/5 with inferior patellar pain      Palpation   Palpation comment  TTP L medial distal femur and medial joint line, TTP L lateral knee and superior lateral patella. TTP patellar tendon. Muscle knot L vastus lateralis.       Ambulation/Gait   Gait Comments  ambulates with one axillary crutch on L side, antalgic pattern with decreased stance, weight bearing, and L knee extension during L LE stance phase.                 Objective measurements completed on examination: See above findings.   Moving to [place] May 14, 2018 for 3 months, then returns to [employer] for college until end of December 2019.   Pain location: medial and lateral knee and inferior patella and at patellar tendon area.   Therapeutic exercise  Adjusted crutch to proper height  Ambulation with axillary crutch on R side (opposite of L knee sprain), 2 point gait pattern. Felt better per pt. Improved L knee extension during stance phase observed.   Seated hip adduction small physioball and glute max squeeze 6x5 seconds. Discomfort which eases with rest.   Improved exercise technique, movement at target joints, use of target muscles after mod verbal, visual, tactile cues.    Try [place] E-stim to L VMO next visit.     Manual therapy  Supine STM L vastus lateralis to decrease muscle tension  105 degrees seated L knee flexion AROM  afterwards      Patient is a 22 year old female who came to physical therapy secondary to acute on chronic L knee pain. She also presents with altered gait pattern and posture, TTP, L glute med and max weakness, decreased femoral control, difficulty weight bearing on L LE, and difficulty performing functional tasks such as walking. Patient will benefit from skilled physical therapy services to address the aforementioned deficits.       PT Education - 04/22/18 1925    Education provided  Yes    Education Details  proper use of crutch, 2 point gait pattern, plan of care, ther-ex    Person(s) Educated  Patient    Methods  Explanation;Demonstration;Verbal cues    Comprehension  Verbalized understanding;Returned demonstration          PT Long Term Goals - 04/22/18 1902      PT LONG TERM GOAL #1   Title  Patient will have a decrease in L knee pain to 3/10 at worst to promote ability to ambulate, perform standing tasks.     Baseline  9/10 at worst for the past 7 days (04/22/2018)    Time  3    Period  Weeks    Status  New    Target Date  05/14/18      PT LONG TERM GOAL #2   Title  Patient will improve L knee flexion AROM to at least 115 degrees and seated L knee extension AROM to at least -10 degrees to promote ability to perform functional tasks.     Baseline  L knee flexion 104 degrees, L knee extension -40 degrees (04/22/2018)    Time  3    Period  Weeks    Status  New    Target Date  05/14/18      PT LONG TERM GOAL #3   Title  Pt will improve her knee FOTO score by at least 10 points as a demonstration of improved function.     Baseline  knee FOTO 30 (04/22/2018)    Time  3    Period  Weeks    Status  New    Target Date  05/14/18      PT LONG TERM GOAL #4   Title  Pt will be able to ambulate at least 200 ft independently to promote mobility.     Baseline  Pt currently ambulates with 1-2 axillary crutches (04/22/2018)    Time  3    Period  Weeks    Status  New    Target  Date  05/14/18      PT LONG TERM GOAL #5   Title  Pt will improve L LE strength by at least 1/2 MMT grade to promote ability to ambulate.     Time  3    Period  Weeks    Status  New    Target Date  05/14/18             Plan - 04/22/18 1857    Clinical Impression Statement  Patient is a 22 year old female who came to physical therapy secondary to acute on chronic L knee pain. She also presents with altered gait pattern and posture, TTP, L glute med and max weakness, decreased femoral control, difficulty weight bearing on L LE, and difficulty performing functional tasks such as walking. Patient will benefit from skilled physical therapy services to address the aforementioned deficits.    History  and Personal Factors relevant to plan of care:  Acute on chronic L knee pain, hip weakness.     Clinical Presentation  Stable    Clinical Presentation due to:  pain level stabilized to around 4/10     Clinical Decision Making  Low    Rehab Potential  Fair    Clinical Impairments Affecting Rehab Potential  hx of previous knee injuries, pain, hip weakness, (+) age, motivated    PT Frequency  2x / week    PT Duration  3 weeks Pt moving to [place] on May 14, 2018.     PT Treatment/Interventions  Therapeutic activities;Therapeutic exercise;Neuromuscular re-education;Manual techniques;Gait training;Functional mobility training;Patient/family education;Dry needling;Aquatic Therapy;Electrical Stimulation;Iontophoresis /ml Dexamethasone;Ultrasound    PT Next Visit Plan  ROM, glute med and max strengthening, pain control, gradual strengthening of quadriceps, manual techniques, modalities PRN    Consulted and Agree with Plan of Care  Patient       Patient will benefit from skilled therapeutic intervention in order to improve the following deficits and impairments:  Abnormal gait, Pain, Postural dysfunction, Improper body mechanics, Decreased strength, Difficulty walking, Decreased range of  motion  Visit Diagnosis: Acute pain of left knee  Muscle weakness (generalized)  Difficulty in walking, not elsewhere classified     Problem List Patient Active Problem List   Diagnosis Date Noted  . PTSD (post-traumatic stress disorder) 02/22/2015    Loralyn Freshwater PT, DPT   04/22/2018, 7:28 PM  [facility] PHYSICAL AND SPORTS MEDICINE [address] [place], [address] Phone: [phone]   Fax:  [phone]  Name: Lauren Braun MRN: [id number] Date of Birth: 1996/02/01

## 2018-04-22 NOTE — Patient Instructions (Signed)
Use of axillary crutch on R side, 2 point gait pattern.

## 2018-04-28 ENCOUNTER — Ambulatory Visit: Payer: Federal, State, Local not specified - PPO

## 2018-04-29 ENCOUNTER — Ambulatory Visit: Payer: Federal, State, Local not specified - PPO

## 2018-04-29 DIAGNOSIS — M25562 Pain in left knee: Secondary | ICD-10-CM

## 2018-04-29 DIAGNOSIS — R262 Difficulty in walking, not elsewhere classified: Secondary | ICD-10-CM

## 2018-04-29 DIAGNOSIS — M6281 Muscle weakness (generalized): Secondary | ICD-10-CM

## 2018-04-29 NOTE — Therapy (Signed)
[facility] [address] [place], [address] Phone: [phone]   Fax:  [phone]  Physical Therapy Treatment  Patient Details  Name: Lauren Braun MRN: [id number] Date of Birth: [birth year] Referring Provider: Viviano Simas, FNP   Encounter Date: 04/29/2018  PT End of Session - 04/29/18 0949    Visit Number  2    Number of Visits  7    Date for PT Re-Evaluation  05/14/18    PT Start Time  0900    PT Stop Time  0945    PT Time Calculation (min)  45 min    Activity Tolerance  Patient tolerated treatment well    Behavior During Therapy  [facility] for tasks assessed/performed       Past Medical History:  Diagnosis Date  . Anxiety   . Depression   . Headache   . Migraines   . PID (pelvic inflammatory disease)   . PMDD (premenstrual dysphoric disorder)     History reviewed. No pertinent surgical history.  There were no vitals filed for this visit.  Subjective Assessment - 04/29/18 0946    Subjective  Patient reports she is feeling better since her last appointment. Patient states improvement with walking and is using a brace for ambulation.     Pertinent History  L knee sprain. Has had problems with her L knee since 22 years old. Has been to PT before. L knee cap keeps dislocating.  Saturday May 4, pt fell over and had a sharp pain in her knee. Pt tried to push her knee cap medially which made it feel better. The next day, pt woke up and her L toes would not move from stiffness. L leg was "purplish" from the knee down. Went to health services who determined that her knee cap is where it needs to be. When the swelling subsided, pt was able to move her toes. The pain now is a constant dull ache. If pt feels a sharp feeling, its inferior to her patella.  Prior PT involved strengthening her medial and lateral thigh as well as straight leg raises.  Pt states that for as long as she can remember, her L knee has never  been fully straight.  Has not had recent imaging for her L knee.  Pt states that her biggest concern is her L knee collapsing when she walks.  Had a fall this afternoon when her dog jumped onto her knee.  No other fall besides that.   The previous PT helped for a little bit which helped decrease the frequency of her injuries.  Pt was using bilateral crutches during long distance walks, but uses one crutch during short distances.  no hx of seizures    Patient Stated Goals  Be able to straighten her L knee and strengthen her L LE to decrease chances of recurrence.     Currently in Pain?  Yes    Pain Score  1     Pain Location  Knee    Pain Orientation  Left    Pain Descriptors / Indicators  Aching;Sharp    Pain Type  Acute pain;Chronic pain    Pain Onset  In the past 7 days    Pain Frequency  Constant         TREATMENT: Therapeutic Exercise: Sitting ball roll outs - x Standing weight shifts in standing - 2 x 20  Leg Press in [place]ME[place] - 2 x 10 25# Tandem  stance - x15min Seated hip abduction - x 20 Seated ball squeezes with glute squeezes - x20  Manual Therapy: STM performed to the quadriceps on the affected side to decrease increased pain and spasms utilizing superficial techniques  Patient demonstrates increased fatigue at the end of the session    PT Education - 04/29/18 0949    Education provided  Yes    Education Details  HEP: seated ball roll outs    Person(s) Educated  Patient    Methods  Explanation;Demonstration    Comprehension  Verbalized understanding;Returned demonstration          PT Long Term Goals - 04/22/18 1902      PT LONG TERM GOAL #1   Title  Patient will have a decrease in L knee pain to 3/10 at worst to promote ability to ambulate, perform standing tasks.     Baseline  9/10 at worst for the past 7 days (04/22/2018)    Time  3    Period  Weeks    Status  New    Target Date  05/14/18      PT LONG TERM GOAL #2   Title  Patient will improve L knee  flexion AROM to at least 115 degrees and seated L knee extension AROM to at least -10 degrees to promote ability to perform functional tasks.     Baseline  L knee flexion 104 degrees, L knee extension -40 degrees (04/22/2018)    Time  3    Period  Weeks    Status  New    Target Date  05/14/18      PT LONG TERM GOAL #3   Title  Pt will improve her knee FOTO score by at least 10 points as a demonstration of improved function.     Baseline  knee FOTO 30 (04/22/2018)    Time  3    Period  Weeks    Status  New    Target Date  05/14/18      PT LONG TERM GOAL #4   Title  Pt will be able to ambulate at least 200 ft independently to promote mobility.     Baseline  Pt currently ambulates with 1-2 axillary crutches (04/22/2018)    Time  3    Period  Weeks    Status  New    Target Date  05/14/18      PT LONG TERM GOAL #5   Title  Pt will improve L LE strength by at least 1/2 MMT grade to promote ability to ambulate.     Time  3    Period  Weeks    Status  New    Target Date  05/14/18            Plan - 04/29/18 0950    Clinical Impression Statement  Patient demonstrates improvement with knee flexion and flexion today versus previous visit indicating functional carryover between treatment sessions. Patient does have increased pain with repeated knee AROM most likely from increased swelling and muscular weakness. Patient demosntrates increased difficulty with performing standing out side of knee brace and will benefit from further skilled therapy to return to prior level of function.     Rehab Potential  Fair    Clinical Impairments Affecting Rehab Potential  hx of previous knee injuries, pain, hip weakness, (+) age, motivated    PT Frequency  2x / week    PT Duration  3 weeks Pt moving to [place] on May 14, 2018.     PT Treatment/Interventions  Therapeutic activities;Therapeutic exercise;Neuromuscular re-education;Manual techniques;Gait training;Functional mobility training;Patient/family  education;Dry needling;Aquatic Therapy;Electrical Stimulation;Iontophoresis /ml Dexamethasone;Ultrasound    PT Next Visit Plan  ROM, glute med and max strengthening, pain control, gradual strengthening of quadriceps, manual techniques, modalities PRN    Consulted and Agree with Plan of Care  Patient       Patient will benefit from skilled therapeutic intervention in order to improve the following deficits and impairments:  Abnormal gait, Pain, Postural dysfunction, Improper body mechanics, Decreased strength, Difficulty walking, Decreased range of motion  Visit Diagnosis: Muscle weakness (generalized)  Acute pain of left knee  Difficulty in walking, not elsewhere classified     Problem List Patient Active Problem List   Diagnosis Date Noted  . PTSD (post-traumatic stress disorder) 02/22/2015    Myrene Galas, PT DPT 04/29/2018, 9:55 AM   [facility] PHYSICAL AND SPORTS MEDICINE [address] [place], [address] Phone: [phone]   Fax:  [phone]  Name: Keyonda Bickle MRN: [id number] Date of Birth: 03/02/1996

## 2018-05-04 ENCOUNTER — Ambulatory Visit: Payer: Federal, State, Local not specified - PPO

## 2018-05-04 DIAGNOSIS — M25562 Pain in left knee: Secondary | ICD-10-CM | POA: Diagnosis not present

## 2018-05-04 DIAGNOSIS — R262 Difficulty in walking, not elsewhere classified: Secondary | ICD-10-CM

## 2018-05-04 DIAGNOSIS — M6281 Muscle weakness (generalized): Secondary | ICD-10-CM

## 2018-05-04 NOTE — Therapy (Signed)
[facility] [address] [place], [address] Phone: [phone]   Fax:  [phone]  Physical Therapy Treatment  Patient Details  Name: Lauren Braun MRN: [id number] Date of Birth: [birth year] Referring Provider: Viviano Simas, FNP   Encounter Date: 05/04/2018  PT End of Session - 05/04/18 0846    Visit Number  3    Number of Visits  7    Date for PT Re-Evaluation  05/14/18    PT Start Time  0846    PT Stop Time  0941    PT Time Calculation (min)  55 min    Activity Tolerance  Patient tolerated treatment well    Behavior During Therapy  [facility] for tasks assessed/performed       Past Medical History:  Diagnosis Date  . Anxiety   . Depression   . Headache   . Migraines   . PID (pelvic inflammatory disease)   . PMDD (premenstrual dysphoric disorder)     No past surgical history on file.  There were no vitals filed for this visit.  Subjective Assessment - 05/04/18 0847    Subjective  Pt states stopped using her crutches 7 days ago (last Monday). The school took them.  Walking without the crutches is fine so long as she is using her knee brace.  A little pain at the bottom of her knee (patellar tendon). Currently in the process of moving out. Moved 6 items of furniture down 3 fligths of stairs. Unable to give her knee adequate rest. 9/10 L knee pain at most for the past 7 days due to apartment moving related activities.       Pertinent History  L knee sprain. Has had problems with her L knee since 22 years old. Has been to PT before. L knee cap keeps dislocating.  Saturday May 4, pt fell over and had a sharp pain in her knee. Pt tried to push her knee cap medially which made it feel better. The next day, pt woke up and her L toes would not move from stiffness. L leg was "purplish" from the knee down. Went to health services who determined that her knee cap is where it needs to be. When the swelling subsided,  pt was able to move her toes. The pain now is a constant dull ache. If pt feels a sharp feeling, its inferior to her patella.  Prior PT involved strengthening her medial and lateral thigh as well as straight leg raises.  Pt states that for as long as she can remember, her L knee has never been fully straight.  Has not had recent imaging for her L knee.  Pt states that her biggest concern is her L knee collapsing when she walks.  Had a fall this afternoon when her dog jumped onto her knee.  No other fall besides that.   The previous PT helped for a little bit which helped decrease the frequency of her injuries.  Pt was using bilateral crutches during long distance walks, but uses one crutch during short distances.  no hx of seizures    Patient Stated Goals  Be able to straighten her L knee and strengthen her L LE to decrease chances of recurrence.     Currently in Pain?  Yes when weight bearing.     Pain Score  3     Pain Onset  In the past 7 days  PT Education - 05/04/18 [phone]    Education provided  Yes    Education Details  ther-ex, HEP    Person(s) Educated  Patient    Methods  Explanation;Demonstration;Tactile cues;Verbal cues;Handout    Comprehension  Returned demonstration;Verbalized understanding       Objectives Pt ambulating independently with hinged knee brace with patellar cutout   Manual therapy  Medial patellar taping to promote proper patellar tracking    Therapeutic exercise   Seated hip adductor small physioball and glute max squeeze 10x5 seconds    Then with gentle isometric leg extension 10x5 seconds for 2 sets  Prone hip extension 10x10 seconds L   Supine bridge 10x5 seconds for 2 sets.   Seated eccentric L leg extension 5x3. Easier to perform following patellar taping.   Seated hip ER 5x5 seconds  S/L hip abduction 10x5 seconds for 2 sets  Leg Press in OMEGA - 2 x 10 25# seat 2, L leg    Improved  exercise technique, movement at target joints, use of target muscles after min to mod verbal, visual, tactile cues.    Pt currently able to ambulate without AD and no LOB. Demonstrates quadriceps weakness with standing and walking without the knee brace. Pt was recommended to continue using the brace for safety. Continued working on glute med and max muscle use to promote femoral control and quadriceps strengthening to promote ability to ambulate and perform standing tasks. Pt tolerated session well without aggravation of symptoms.         PT Long Term Goals - 04/22/18 1902      PT LONG TERM GOAL #1   Title  Patient will have a decrease in L knee pain to 3/10 at worst to promote ability to ambulate, perform standing tasks.     Baseline  9/10 at worst for the past 7 days (04/22/2018)    Time  3    Period  Weeks    Status  New    Target Date  05/14/18      PT LONG TERM GOAL #2   Title  Patient will improve L knee flexion AROM to at least 115 degrees and seated L knee extension AROM to at least -10 degrees to promote ability to perform functional tasks.     Baseline  L knee flexion 104 degrees, L knee extension -40 degrees (04/22/2018)    Time  3    Period  Weeks    Status  New    Target Date  05/14/18      PT LONG TERM GOAL #3   Title  Pt will improve her knee FOTO score by at least 10 points as a demonstration of improved function.     Baseline  knee FOTO 30 (04/22/2018)    Time  3    Period  Weeks    Status  New    Target Date  05/14/18      PT LONG TERM GOAL #4   Title  Pt will be able to ambulate at least 200 ft independently to promote mobility.     Baseline  Pt currently ambulates with 1-2 axillary crutches (04/22/2018)    Time  3    Period  Weeks    Status  New    Target Date  05/14/18      PT LONG TERM GOAL #5   Title  Pt will improve L LE strength by at least 1/2 MMT grade to promote ability to ambulate.  Time  3    Period  Weeks    Status  New    Target Date   05/14/18            Plan - 05/04/18 0845    Clinical Impression Statement  Pt currently able to ambulate without AD and no LOB. Demonstrates quadriceps weakness with standing and walking without the knee brace. Pt was recommended to continue using the brace for safety. Continued working on glute med and max muscle use to promote femoral control and quadriceps strengthening to promote ability to ambulate and perform standing tasks. Pt tolerated session well without aggravation of symptoms.     Rehab Potential  Fair    Clinical Impairments Affecting Rehab Potential  hx of previous knee injuries, pain, hip weakness, (+) age, motivated    PT Frequency  2x / week    PT Duration  3 weeks Pt moving to [place] on May 14, 2018.     PT Treatment/Interventions  Therapeutic activities;Therapeutic exercise;Neuromuscular re-education;Manual techniques;Gait training;Functional mobility training;Patient/family education;Dry needling;Aquatic Therapy;Electrical Stimulation;Iontophoresis /ml Dexamethasone;Ultrasound    PT Next Visit Plan  ROM, glute med and max strengthening, pain control, gradual strengthening of quadriceps, manual techniques, modalities PRN    Consulted and Agree with Plan of Care  Patient       Patient will benefit from skilled therapeutic intervention in order to improve the following deficits and impairments:  Abnormal gait, Pain, Postural dysfunction, Improper body mechanics, Decreased strength, Difficulty walking, Decreased range of motion  Visit Diagnosis: Muscle weakness (generalized)  Acute pain of left knee  Difficulty in walking, not elsewhere classified     Problem List Patient Active Problem List   Diagnosis Date Noted  . PTSD (post-traumatic stress disorder) 02/22/2015    Loralyn Freshwater PT, DPT   05/04/2018, 9:52 AM  [facility] SPORTS MEDICINE [address] [place], [address] Phone: [phone]   Fax:   [phone]  Name: Lauren Braun MRN: [id number] Date of Birth: [birth year]

## 2018-05-04 NOTE — Patient Instructions (Addendum)
  Adduction: Hip - Knees Together (Sitting)   Sit with a small ball between knees. Squeeze rear end muscles together. Push knees together. Hold for _5__ seconds. Repeat _10__ times. Do __3_ times a day.  You can gently isometrically extend your knees in a pain-free level of effort.   Copyright  VHI. All rights reserved.       Bridge   Lie on back, legs bent. Squeeze rear end muscles. Lift hips up. Hold for 5 seconds. Repeat __10__ times. Do __3__ sessions per day.  Copyright  VHI. All rights reserved.      Sitting  Straighten your left leg   Slowly lower it (non-painful range)    Repeat 5 times   Perform 3 sets daily.

## 2018-05-06 ENCOUNTER — Ambulatory Visit: Payer: Federal, State, Local not specified - PPO

## 2018-05-06 DIAGNOSIS — M6281 Muscle weakness (generalized): Secondary | ICD-10-CM

## 2018-05-06 DIAGNOSIS — M25562 Pain in left knee: Secondary | ICD-10-CM | POA: Diagnosis not present

## 2018-05-06 DIAGNOSIS — R262 Difficulty in walking, not elsewhere classified: Secondary | ICD-10-CM

## 2018-05-06 NOTE — Patient Instructions (Signed)
Prone glute max extension  Lying on your stomach   Bend your left knee   Squeeze your rear end muscles    Lift your thigh up    Hold for 10 seconds   Repeat 10 times.    Perform 3 sessions daily

## 2018-05-06 NOTE — Therapy (Signed)
[place] [facility] [address], [place], [address] Phone: [phone]   Fax:  [phone]  Physical Therapy Treatment And Discharge Summary  Patient Details  Name: Lauren Braun MRN: [id number] Date of Birth: 10/26/1996 Referring Provider: Apolonio Schneiders, FNP   Encounter Date: 05/06/2018  PT End of Session - 05/06/18 1020    Visit Number  4    Number of Visits  7    Date for PT Re-Evaluation  05/14/18    PT Start Time  1020    PT Stop Time  1104    PT Time Calculation (min)  44 min    Activity Tolerance  Patient tolerated treatment well    Behavior During Therapy  [facility] for tasks assessed/performed       Past Medical History:  Diagnosis Date  . Anxiety   . Depression   . Headache   . Migraines   . PID (pelvic inflammatory disease)   . PMDD (premenstrual dysphoric disorder)     No past surgical history on file.  There were no vitals filed for this visit.  Subjective Assessment - 05/06/18 1021    Subjective  Able to walk up and down 10 steps no rail assist this morning without her knee brace and her knee was fine. No increased pain.  No pain currently.  Pt states that today has to be her last session because her partner booked the moving truck a week earlier than expected.     Pertinent History  L knee sprain. Has had problems with her L knee since 22 years old. Has been to PT before. L knee cap keeps dislocating.  Saturday May 4, pt fell over and had a sharp pain in her knee. Pt tried to push her knee cap medially which made it feel better. The next day, pt woke up and her L toes would not move from stiffness. L leg was "purplish" from the knee down. Went to health services who determined that her knee cap is where it needs to be. When the swelling subsided, pt was able to move her toes. The pain now is a constant dull ache. If pt feels a sharp feeling, its inferior to her patella.  Prior PT involved  strengthening her medial and lateral thigh as well as straight leg raises.  Pt states that for as long as she can remember, her L knee has never been fully straight.  Has not had recent imaging for her L knee.  Pt states that her biggest concern is her L knee collapsing when she walks.  Had a fall this afternoon when her dog jumped onto her knee.  No other fall besides that.   The previous PT helped for a little bit which helped decrease the frequency of her injuries.  Pt was using bilateral crutches during long distance walks, but uses one crutch during short distances.  no hx of seizures    Patient Stated Goals  Be able to straighten her L knee and strengthen her L LE to decrease chances of recurrence.     Currently in Pain?  No/denies    Pain Score  0-No pain    Pain Onset  In the past 7 days         [facility] PT Assessment - 05/06/18 1038      Strength   Left Hip Extension  4+/5    Left Hip External Rotation  4/5 No pain    Left Hip ABduction  4+/5 no pain    Left Knee Flexion  5/5 no pain    Left Knee Extension  5/5 no pain                           PT Education - 05/06/18 1025    Education provided  Yes    Education Details  ther-ex    [employer]) Educated  Patient    Methods  Explanation;Demonstration;Tactile cues;Verbal cues    Comprehension  Returned demonstration;Verbalized understanding         Objectives Pt ambulating independently with hinged knee brace with patellar cutout   Therapeutic exercise   Seated hip adductor small physioball and glute Lauren squeeze 10x5 seconds for 2 sets with gentle isometric leg extension    Leg Press in OMEGA - 2 x 10 25# seat 2, L leg  L knee flexion and extension AROM 1x  Reviewed progress/current status with ROM with pt.   Gait 200 ft without brace. No pain. More sturdy. No LOB.   Seated manually resisted L hip ER, knee flexion, knee extension, S/L hip abduction, prone hip extension 1x each way.   Reviewed  progress with strength with pt.   Prone hip extension 10x10 seconds L   S/L hip abduction 10x5 seconds for 2 sets   Seated hip ER 10x5 seconds   Seated eccentric L leg extension 10x  Ascending and descending 4 regular steps 2x, no UE assist with emphasis on femoral control. No pain.   Improved exercise technique, movement at target joints, use of target muscles after min to  mod verbal, visual, tactile cues.    Pt demonstrates overall decreased in L knee pain, improved L LE strength, ability to ambulate and negotiate stairs without L knee pain and improved function since initial evaluation. Pt has made very good progress towards goals. Skilled physical therapy services discharged with pt continuing progress with her HEP.      Marland Kitchen     PT Long Term Goals - 05/06/18 1031      PT LONG TERM GOAL #1   Title  Patient will have a decrease in L knee pain to 3/10 at worst to promote ability to ambulate, perform standing tasks.     Baseline  9/10 at worst for the past 7 days (04/22/2018); 3/10 at most other than the moving activities to move out of her appartment (8-9/10 after moving her furniture 3 flights of steps) 05/06/2018.     Time  3    Period  Weeks    Status  Partially Met    Target Date  05/06/18      PT LONG TERM GOAL #2   Title  Patient will improve L knee flexion AROM to at least 115 degrees and seated L knee extension AROM to at least -10 degrees to promote ability to perform functional tasks.     Baseline  L knee flexion 104 degrees, L knee extension -40 degrees (04/22/2018); 130 degrees flexion, 2 degrees extension (05/06/2018)    Time  3    Period  Weeks    Status  Achieved    Target Date  05/06/18      PT LONG TERM GOAL #3   Title  Pt will improve her knee FOTO score by at least 10 points as a demonstration of improved function.     Baseline  knee FOTO 30 (04/22/2018); 70 (05/06/2018)    Time  3    Period  Weeks    Status  Achieved    Target Date  05/06/18      PT  LONG TERM GOAL #4   Title  Pt will be able to ambulate at least 200 ft independently to promote mobility.     Baseline  Pt currently ambulates with 1-2 axillary crutches (04/22/2018); 200 ft no AD, and no brace, no LOB, pt looked comfortable. 05/06/2018.     Time  3    Period  Weeks    Status  Achieved    Target Date  05/06/18      PT LONG TERM GOAL #5   Title  Pt will improve L LE strength by at least 1/2 MMT grade to promote ability to ambulate.     Time  3    Period  Weeks    Status  Achieved    Target Date  05/06/18            Plan - 05/06/18 1025    Clinical Impression Statement  Pt demonstrates overall decreased in L knee pain, improved L LE strength, ability to ambulate and negotiate stairs without L knee pain and improved function since initial evaluation. Pt has made very good progress towards goals. Skilled physical therapy services discharged with pt continuing progress with her HEP.     Rehab Potential  --    Clinical Impairments Affecting Rehab Potential  --    PT Frequency  --    PT Duration  -- Pt moving to [place] on May 14, 2018.     PT Treatment/Interventions  Therapeutic activities;Therapeutic exercise;Neuromuscular re-education;Manual techniques;Gait training;Patient/family education;Electrical Stimulation    PT Next Visit Plan  Continue progress with her HEP.     Consulted and Agree with Plan of Care  Patient       Patient will benefit from skilled therapeutic intervention in order to improve the following deficits and impairments:     Visit Diagnosis: Muscle weakness (generalized)  Acute pain of left knee  Difficulty in walking, not elsewhere classified     Problem List Patient Active Problem List   Diagnosis Date Noted  . PTSD (post-traumatic stress disorder) 02/22/2015    Thank you for your referral.  Joneen Boers PT, DPT   05/06/2018, 7:01 PM  [place] PHYSICAL AND SPORTS MEDICINE [address] [place], [address] Phone: [phone]   Fax:  [phone]  Name: Lauren Braun MRN: [id number] Date of Birth: 12-29-1995

## 2018-05-13 ENCOUNTER — Ambulatory Visit: Payer: Federal, State, Local not specified - PPO

## 2018-05-14 ENCOUNTER — Encounter: Payer: Federal, State, Local not specified - PPO | Admitting: Physical Therapy

## 2018-07-08 ENCOUNTER — Other Ambulatory Visit: Payer: Self-pay

## 2018-07-08 ENCOUNTER — Telehealth: Payer: Self-pay | Admitting: Obstetrics and Gynecology

## 2018-07-08 DIAGNOSIS — Z30011 Encounter for initial prescription of contraceptive pills: Secondary | ICD-10-CM

## 2018-07-08 MED ORDER — DESOGESTREL-ETHINYL ESTRADIOL 0.15-0.02/0.01 MG (21/5) PO TABS: 1.0000 | ORAL_TABLET | Freq: Every day | ORAL | 0 refills | Status: AC

## 2018-07-08 NOTE — Telephone Encounter (Signed)
The patient called and stated that she is out of town and she forgot her birth control. The patient stated that she needs her b.c. Today or she will have to go and buy an over the counter (Plan B). The patient would like a call back as soon as possible. Please advise.

## 2018-07-09 ENCOUNTER — Other Ambulatory Visit: Payer: Self-pay | Admitting: Obstetrics and Gynecology

## 2018-07-09 DIAGNOSIS — Z30011 Encounter for initial prescription of contraceptive pills: Secondary | ICD-10-CM

## 2018-10-27 ENCOUNTER — Other Ambulatory Visit: Payer: Self-pay | Admitting: Obstetrics and Gynecology

## 2018-10-27 DIAGNOSIS — Z30011 Encounter for initial prescription of contraceptive pills: Secondary | ICD-10-CM

## 2019-01-16 ENCOUNTER — Other Ambulatory Visit: Payer: Self-pay | Admitting: Obstetrics and Gynecology

## 2019-01-16 DIAGNOSIS — Z30011 Encounter for initial prescription of contraceptive pills: Secondary | ICD-10-CM
